# Patient Record
Sex: Female | Born: 1995 | Race: Black or African American | Hispanic: No | Marital: Single
Health system: Southern US, Community
[De-identification: ages and names within clinical notes are randomized; demographics above are authoritative.]

---

## 2019-04-04 ENCOUNTER — Other Ambulatory Visit: Payer: Self-pay

## 2019-04-04 ENCOUNTER — Emergency Department (HOSPITAL_COMMUNITY): Payer: Self-pay

## 2019-04-04 ENCOUNTER — Emergency Department (HOSPITAL_COMMUNITY)
Admission: EM | Admit: 2019-04-04 | Discharge: 2019-04-04 | Disposition: A | Discharge status: Home / Self Care | Payer: Self-pay | Attending: Emergency Medicine | Admitting: Emergency Medicine

## 2019-04-04 DIAGNOSIS — R079 Chest pain, unspecified: Secondary | ICD-10-CM | POA: Insufficient documentation

## 2019-04-04 LAB — CBC
HCT: 38.4 % (ref 36.0–46.0)
Hemoglobin: 12.1 g/dL (ref 12.0–15.0)
MCH: 26.5 pg (ref 26.0–34.0)
MCHC: 31.5 g/dL (ref 30.0–36.0)
MCV: 84 fL (ref 80.0–100.0)
Platelets: 343 10*3/uL (ref 150–400)
RBC: 4.57 MIL/uL (ref 3.87–5.11)
RDW: 14.6 % (ref 11.5–15.5)
WBC: 8.6 10*3/uL (ref 4.0–10.5)
nRBC: 0 % (ref 0.0–0.2)

## 2019-04-04 LAB — BASIC METABOLIC PANEL
Anion gap: 9 (ref 5–15)
BUN: 8 mg/dL (ref 6–20)
CO2: 21 mmol/L — ABNORMAL LOW (ref 22–32)
Calcium: 8.8 mg/dL — ABNORMAL LOW (ref 8.9–10.3)
Chloride: 108 mmol/L (ref 98–111)
Creatinine, Ser: 0.92 mg/dL (ref 0.44–1.00)
GFR calc Af Amer: >60 mL/min (ref >60)
GFR calc non Af Amer: >60 mL/min (ref >60)
Glucose, Bld: 103 mg/dL — ABNORMAL HIGH (ref 70–99)
Potassium: 3.8 mmol/L (ref 3.5–5.1)
Sodium: 138 mmol/L (ref 135–145)

## 2019-04-04 LAB — I-STAT BETA HCG BLOOD, ED (MC, WL, AP ONLY): I-stat hCG, quantitative: <5 m[IU]/mL (ref <5)

## 2019-04-04 LAB — TROPONIN I (HIGH SENSITIVITY): Troponin I (High Sensitivity): 4 ng/L (ref <18)

## 2019-04-04 MED ORDER — SODIUM CHLORIDE 0.9% FLUSH: 3.0000 mL | Freq: Once | INTRAVENOUS | Status: DC

## 2019-04-04 NOTE — ED Provider Notes (Signed)
[facility]   CSN: [id number] Arrival date & time: 04/04/19  1652     History   Chief Complaint Chief Complaint  Patient presents with  . Chest Pain    HPI Kelsey Stevenson is a 23 y.o. female with no significant past medical history presenting to the emergency department chest pain.  She reports onset of her symptoms when she was crying and emotionally upset yesterday.  She describes both sharp and pressure sensation in her left lower chest which does not radiate anywhere.  She has never experienced this before.  The pain is intermittent and she is currently not having any chest pain.  It is not associated with activity or inspiration.  It seems to occur at random.  It is also not associated with any other symptoms including lightheadedness or diaphoresis.  No hemoptysis or asymmetric LE edema. Patient denies personal or family history of DVT or PE. No recent hormone use (including OCP); travel for >6 hours; prolonged immobilization for greater than 3 days; surgeries or trauma in the last 4 weeks; or malignancy with treatment within 6 months.  She has no history of smoking.  She has no significant family history of cardiac disease.     HPI  No past medical history on file.  There are no active problems to display for this patient.   OB History   No obstetric history on file.      Home Medications    Prior to Admission medications   Not on File    Family History No family history on file.  Social History Social History   Tobacco Use  . Smoking status: Not on file  Substance Use Topics  . Alcohol use: Not on file  . Drug use: Not on file     Allergies   Patient has no known allergies.   Review of Systems Review of Systems  Constitutional: Negative for chills and fever.  HENT: Negative for ear pain and sore throat.   Respiratory: Negative for cough and shortness of breath.   Cardiovascular: Positive for  chest pain. Negative for palpitations and leg swelling.  Gastrointestinal: Negative for nausea and vomiting.  Musculoskeletal: Negative for arthralgias and back pain.  Skin: Negative for pallor and rash.  Neurological: Negative for seizures and syncope.  All other systems reviewed and are negative.    Physical Exam Updated Vital Signs BP 110/74   Pulse 82   Temp 99.4 F (37.4 C) (Oral)   Resp 16   SpO2 100%   Physical Exam Vitals signs and nursing note reviewed.  Constitutional:      General: She is not in acute distress.    Appearance: She is well-developed.  HENT:     Head: Normocephalic and atraumatic.  Eyes:     Conjunctiva/sclera: Conjunctivae normal.  Neck:     Musculoskeletal: Neck supple.  Cardiovascular:     Rate and Rhythm: Normal rate and regular rhythm.     Heart sounds: Normal heart sounds. No murmur.  Pulmonary:     Effort: Pulmonary effort is normal. No respiratory distress.     Breath sounds: Normal breath sounds.  Abdominal:     Palpations: Abdomen is soft.     Tenderness: There is no abdominal tenderness.  Skin:    General: Skin is warm and dry.  Neurological:     General: No focal deficit present.     Mental Status: She is alert and oriented to person, place,  and time.      ED Treatments / Results  Labs (all labs ordered are listed, but only abnormal results are displayed) Labs Reviewed  BASIC METABOLIC PANEL - Abnormal; Notable for the following components:      Result Value   CO2 21 (*)    Glucose, Bld 103 (*)    Calcium 8.8 (*)    All other components within normal limits  CBC  I-STAT BETA HCG BLOOD, ED ([facility], [facility], [facility] ONLY)  TROPONIN I (HIGH SENSITIVITY)  TROPONIN I (HIGH SENSITIVITY)    EKG EKG Interpretation  Date/Time:  Tuesday April 04 2019 17:06:08 EDT Ventricular Rate:  79 PR Interval:  136 QRS Duration: 72 QT Interval:  362 QTC Calculation: 415 R Axis:   55 Text Interpretation:  Normal sinus rhythm with sinus  arrhythmia No STEMI  Confirmed by Octaviano Glow [phone]) on 04/04/2019 9:23:58 PM   Radiology Dg Chest 2 View  Result Date: 04/04/2019 CLINICAL DATA:  Chest pain EXAM: CHEST - 2 VIEW COMPARISON:  None. FINDINGS: The heart size and mediastinal contours are within normal limits. Both lungs are clear. The visualized skeletal structures are unremarkable. IMPRESSION: No active cardiopulmonary disease. Electronically Signed   By: Donavan Foil M.D.   On: 04/04/2019 17:53    Procedures Procedures (including critical care time)  Medications Ordered in ED Medications  sodium chloride flush (NS) 0.9 % injection 3 mL (has no administration in time range)     Initial Impression / Assessment and Plan / ED Course  I have reviewed the triage vital signs and the nursing notes.  Pertinent labs & imaging results that were available during my care of the patient were reviewed by me and considered in my medical decision making (see chart for details).  Patient is a healthy 23 year old female presenting with intermittent episodes of chest pain beginning yesterday during a moment of stress and crying.  Her symptoms are intermittent and currently are gone completely.  She came to the ER to get "checked out".  She is PERC negative in the emergency department.  I have a low suspicion for pulmonary embolism, especially in the setting of no hypoxia no tachycardia and her symptoms being intermittent.  I likewise have a low suspicion for acute coronary syndrome, with no significant personal or family risk factors.  Her chest x-ray upon arrival does not demonstrate signs of pneumonia. She has no other respiratory infectious symptoms to suggest COVID-19.  I explained to her that her symptoms may be caused by a variety of etiologies but cannot test for in the emergency department, including reflux and musculoskeletal pain, as well as anxiety and panic attacks.  We discussed return precautions.  She verbalized  understanding.  We will discharge her now.    Final Clinical Impressions(s) / ED Diagnoses   Final diagnoses:  Chest pain, unspecified type    ED Discharge Orders    None       Wyvonnia Dusky, MD 04/05/19 0111

## 2019-04-04 NOTE — ED Triage Notes (Signed)
Pt reports intermittent L sided CP onset yesterday. Denies SOB. Reports yesterday she was upset and unsure if it is related to stress/anxiety.

## 2020-08-27 IMAGING — DX DG CHEST 2V
2 series · 2 of 2 positions shown · non-contrast
Comparison: None.

CLINICAL DATA: Chest pain

EXAM:
CHEST - 2 VIEW

[chest pa]
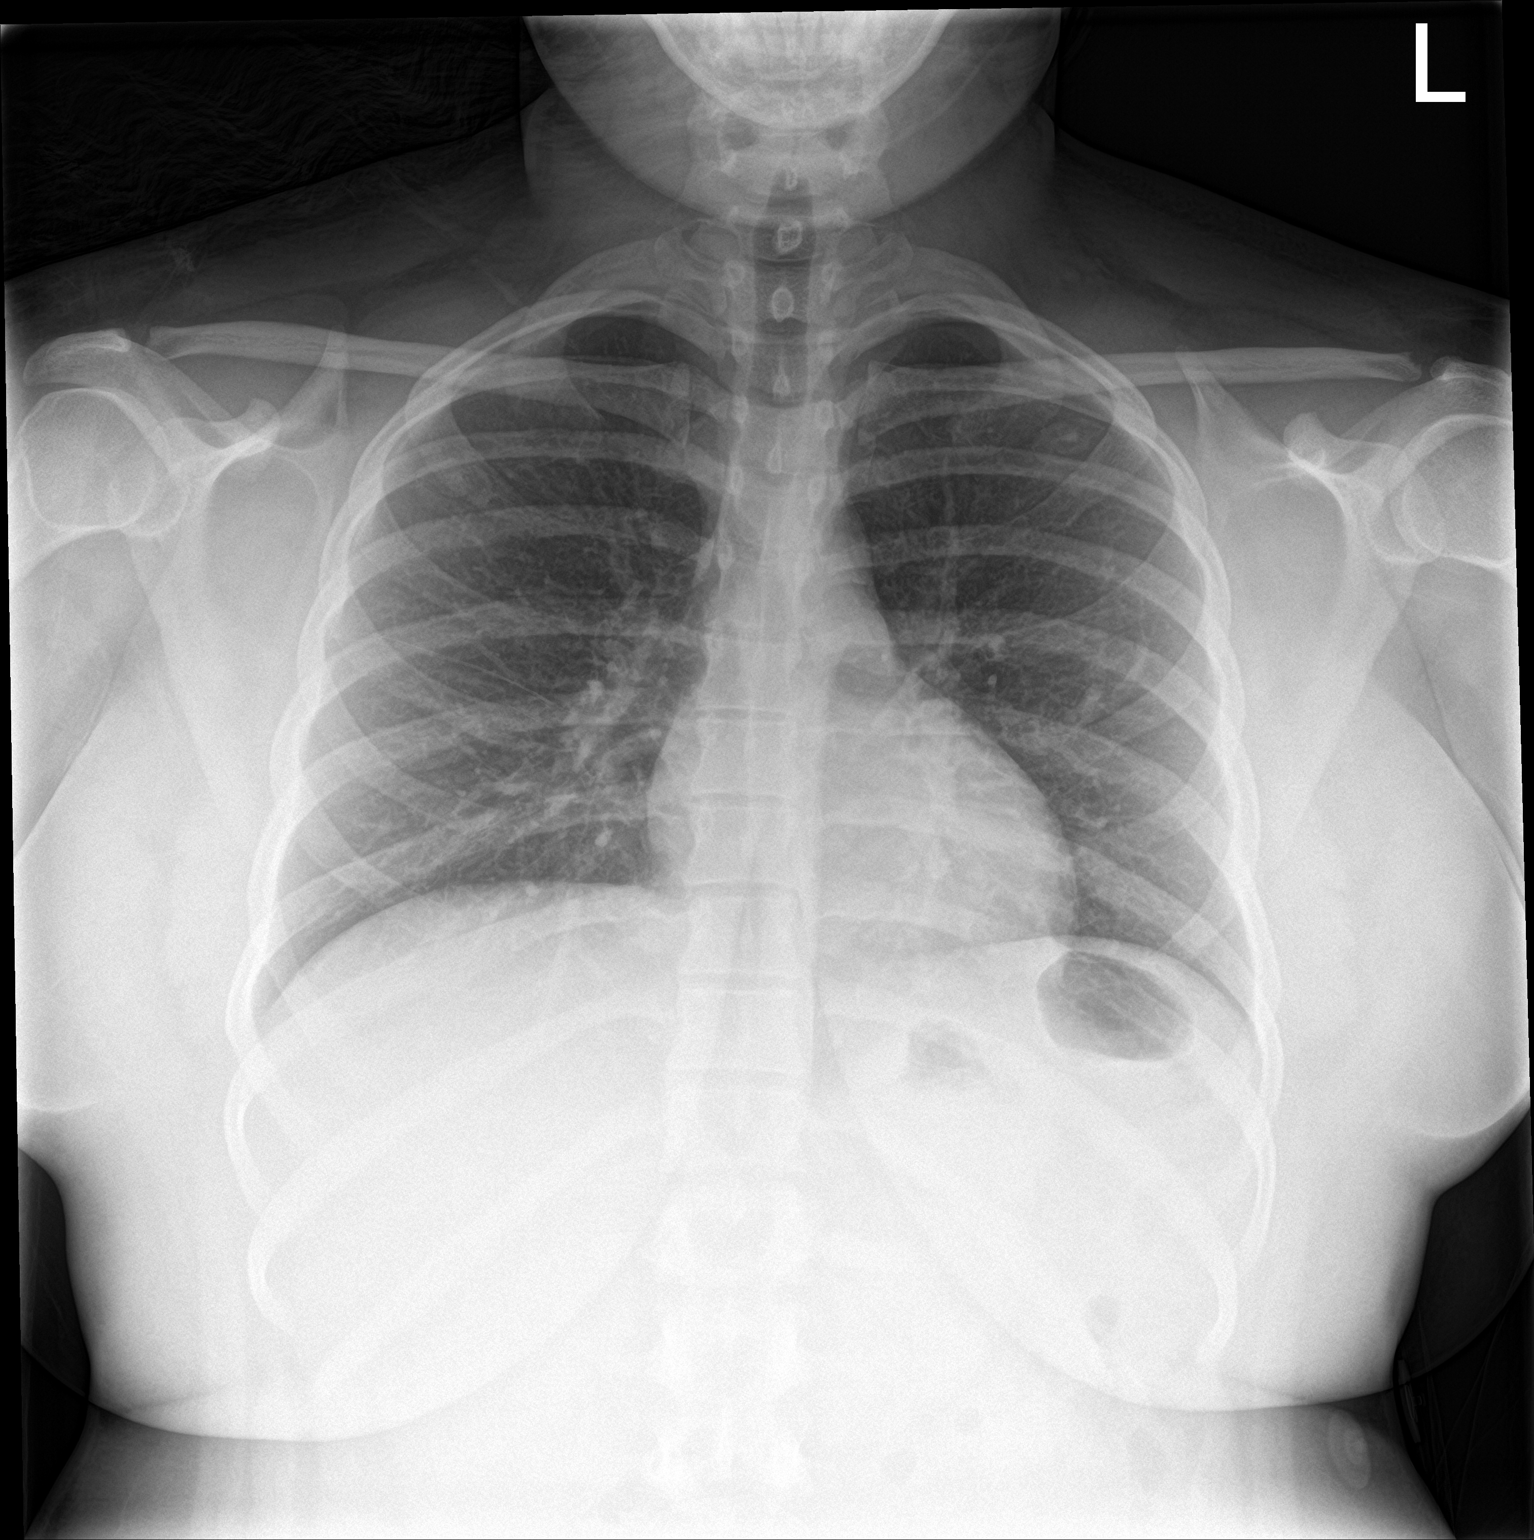

[chest lat]
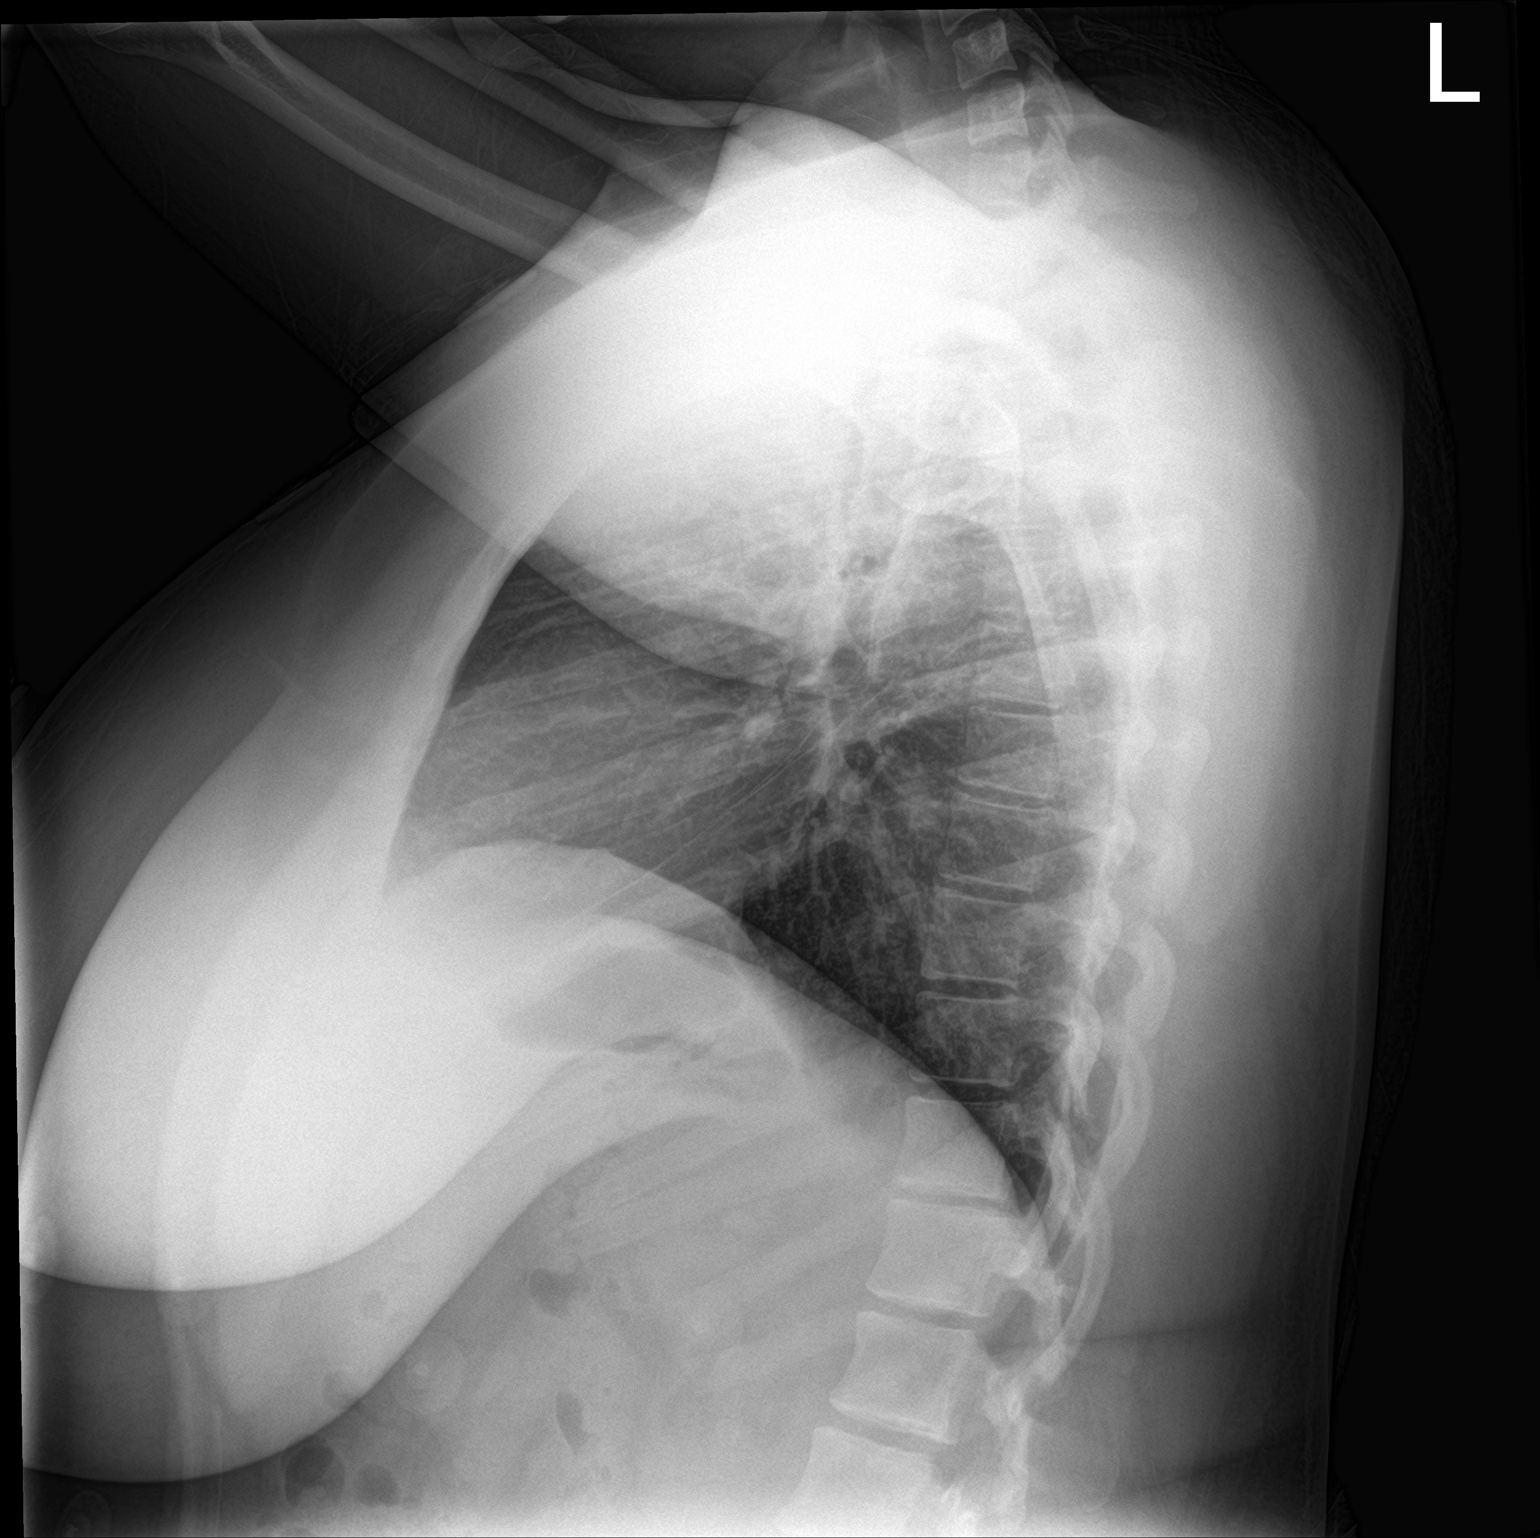

[2 of 2 positions shown; findings below may reference images not displayed]

FINDINGS: The heart size and mediastinal contours are within normal limits.
Both lungs are clear. The visualized skeletal structures are
unremarkable.
IMPRESSION: No active cardiopulmonary disease.
